# Patient Record
Sex: Male | Born: 2000 | Hispanic: Yes | Marital: Single | Smoking: Current some day smoker
Health system: Southern US, Community
[De-identification: ages and names within clinical notes are randomized; demographics above are authoritative.]

---

## 2020-05-05 ENCOUNTER — Encounter (HOSPITAL_COMMUNITY): Payer: Self-pay | Admitting: Emergency Medicine

## 2020-05-05 ENCOUNTER — Emergency Department (HOSPITAL_COMMUNITY)
Admission: EM | Admit: 2020-05-05 | Discharge: 2020-05-06 | Disposition: A | Discharge status: Home / Self Care | Payer: Self-pay | Attending: Emergency Medicine | Admitting: Emergency Medicine

## 2020-05-05 ENCOUNTER — Other Ambulatory Visit: Payer: Self-pay

## 2020-05-05 DIAGNOSIS — Z720 Tobacco use: Secondary | ICD-10-CM | POA: Insufficient documentation

## 2020-05-05 DIAGNOSIS — J029 Acute pharyngitis, unspecified: Secondary | ICD-10-CM | POA: Insufficient documentation

## 2020-05-05 NOTE — ED Triage Notes (Addendum)
Pt is spanish speaking. Pt c/o sore throat with painful swallowing x 1 week. No sick contacts. Pt states he was eating 1 week ago and found a piece of metal in his food and feels like that may have scratched his throat.

## 2020-05-06 ENCOUNTER — Emergency Department (HOSPITAL_COMMUNITY): Payer: Self-pay

## 2020-05-06 MED ORDER — AMOXICILLIN 500 MG PO CAPS: 500.0000 mg | ORAL_CAPSULE | Freq: Three times a day (TID) | ORAL | 0 refills | Status: AC

## 2020-05-06 NOTE — ED Provider Notes (Signed)
[facility] EMERGENCY DEPARTMENT Provider Note   CSN: [id number] Arrival date & time: 05/05/20  2220     History Chief Complaint  Patient presents with   Sore Throat    Tanner Soto is a 19 y.o. male.  Patient presents to the ED with a chief complaint of sore throat.  He states that approximately 1 week ago he swallowed and found a bit of metal in his food.  He states that he was able to remove the piece of metal, but thinks that it scratched his throat.  He is uncertain if he got all of it out.  He denies any fevers, chills, or cough.  Denies any other associated symptoms.  He complains of gradually worsening sore throat.  Denies any treatments prior to arrival.  The history is provided by the patient. The history is limited by a language barrier. A language interpreter was used.       History reviewed. No pertinent past medical history.  There are no problems to display for this patient.   History reviewed. No pertinent surgical history.     No family history on file.  Social History   Tobacco Use   Smoking status: Current Some Day Smoker   Smokeless tobacco: Never Used  Substance Use Topics   Alcohol use: Yes   Drug use: Never    Home Medications Prior to Admission medications   Not on File    Allergies    Patient has no known allergies.  Review of Systems   Review of Systems  All other systems reviewed and are negative.   Physical Exam Updated Vital Signs BP (!) 109/52 (BP Location: Right Arm)    Pulse (!) 56    Temp 99 F (37.2 C) (Oral)    Resp 16    SpO2 98%   Physical Exam Vitals and nursing note reviewed.  Constitutional:      General: He is not in acute distress.    Appearance: He is well-developed. He is not ill-appearing.  HENT:     Head: Normocephalic and atraumatic.     Mouth/Throat:     Comments: Oropharynx is moderately erythematous, with some minor tonsillar exudates, no evidence of tonsillar or  peritonsillar abscess, uvula is midline, airway intact, no stridor Eyes:     Conjunctiva/sclera: Conjunctivae normal.  Cardiovascular:     Rate and Rhythm: Normal rate.  Pulmonary:     Effort: Pulmonary effort is normal. No respiratory distress.  Abdominal:     General: There is no distension.  Musculoskeletal:     Cervical back: Neck supple.     Comments: Moves all extremities  Skin:    General: Skin is warm and dry.  Neurological:     Mental Status: He is alert and oriented to person, place, and time.  Psychiatric:        Mood and Affect: Mood normal.        Behavior: Behavior normal.     ED Results / Procedures / Treatments   Labs (all labs ordered are listed, but only abnormal results are displayed) Labs Reviewed - No data to display  EKG None  Radiology DG Neck Soft Tissue  Result Date: 05/06/2020 CLINICAL DATA:  Possibly swallowed metal last week.  Sore throat. EXAM: NECK SOFT TISSUES - 1+ VIEW COMPARISON:  None. FINDINGS: There is no evidence of retropharyngeal soft tissue swelling or epiglottic enlargement. The cervical airway is unremarkable and no radio-opaque foreign body identified. IMPRESSION: Negative. Electronically Signed  By: Marnee Spring M.D.   On: 05/06/2020 04:40    Procedures Procedures (including critical care time)  Medications Ordered in ED Medications - No data to display  ED Course  I have reviewed the triage vital signs and the nursing notes.  Pertinent labs & imaging results that were available during my care of the patient were reviewed by me and considered in my medical decision making (see chart for details).    MDM Rules/Calculators/A&P                          Patient here with sore throat.  He reports swallowing a piece of metal, or having a piece of metal get stuck in his throat about a week ago.  He was able to remove the piece of metal, but is uncertain if he got all of it.  Plain films of the neck show no evidence of metallic  foreign body.  His physical exam is consistent with pharyngitis.  He has oropharyngeal erythema with mild edema and tonsillar exudates.  Given the length of time of symptoms, will cover with amoxicillin.  Discharged home.  Return precautions discussed. Final Clinical Impression(s) / ED Diagnoses Final diagnoses:  Pharyngitis, unspecified etiology    Rx / DC Orders ED Discharge Orders         Ordered    amoxicillin (AMOXIL) 500 MG capsule  3 times daily     Discontinue  Reprint     05/06/20 0444           Roxy Horseman, PA-C 05/06/20 0446    Zadie Rhine, MD 05/07/20 [phone]

## 2020-05-06 NOTE — ED Notes (Signed)
Patient transported to X-ray 

## 2022-01-29 IMAGING — CR DG NECK SOFT TISSUE
2 series · 2 of 2 positions shown · non-contrast
Comparison: None.

CLINICAL DATA: Possibly swallowed metal last week.  Sore throat.

EXAM:
NECK SOFT TISSUES - 1+ VIEW

[neck lat]
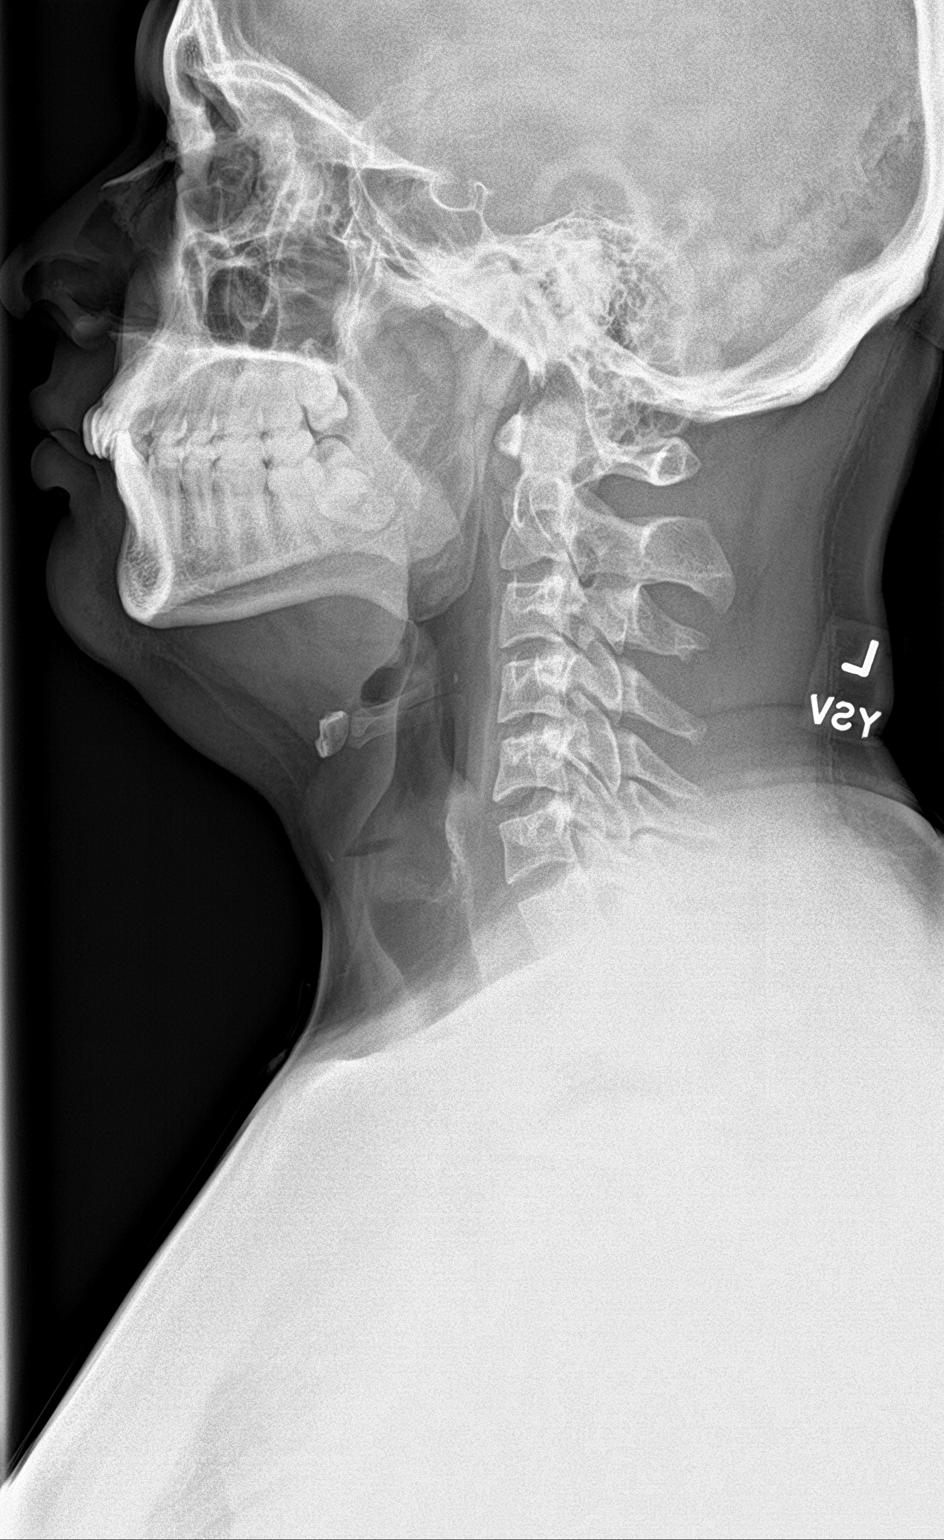

[neck ap]
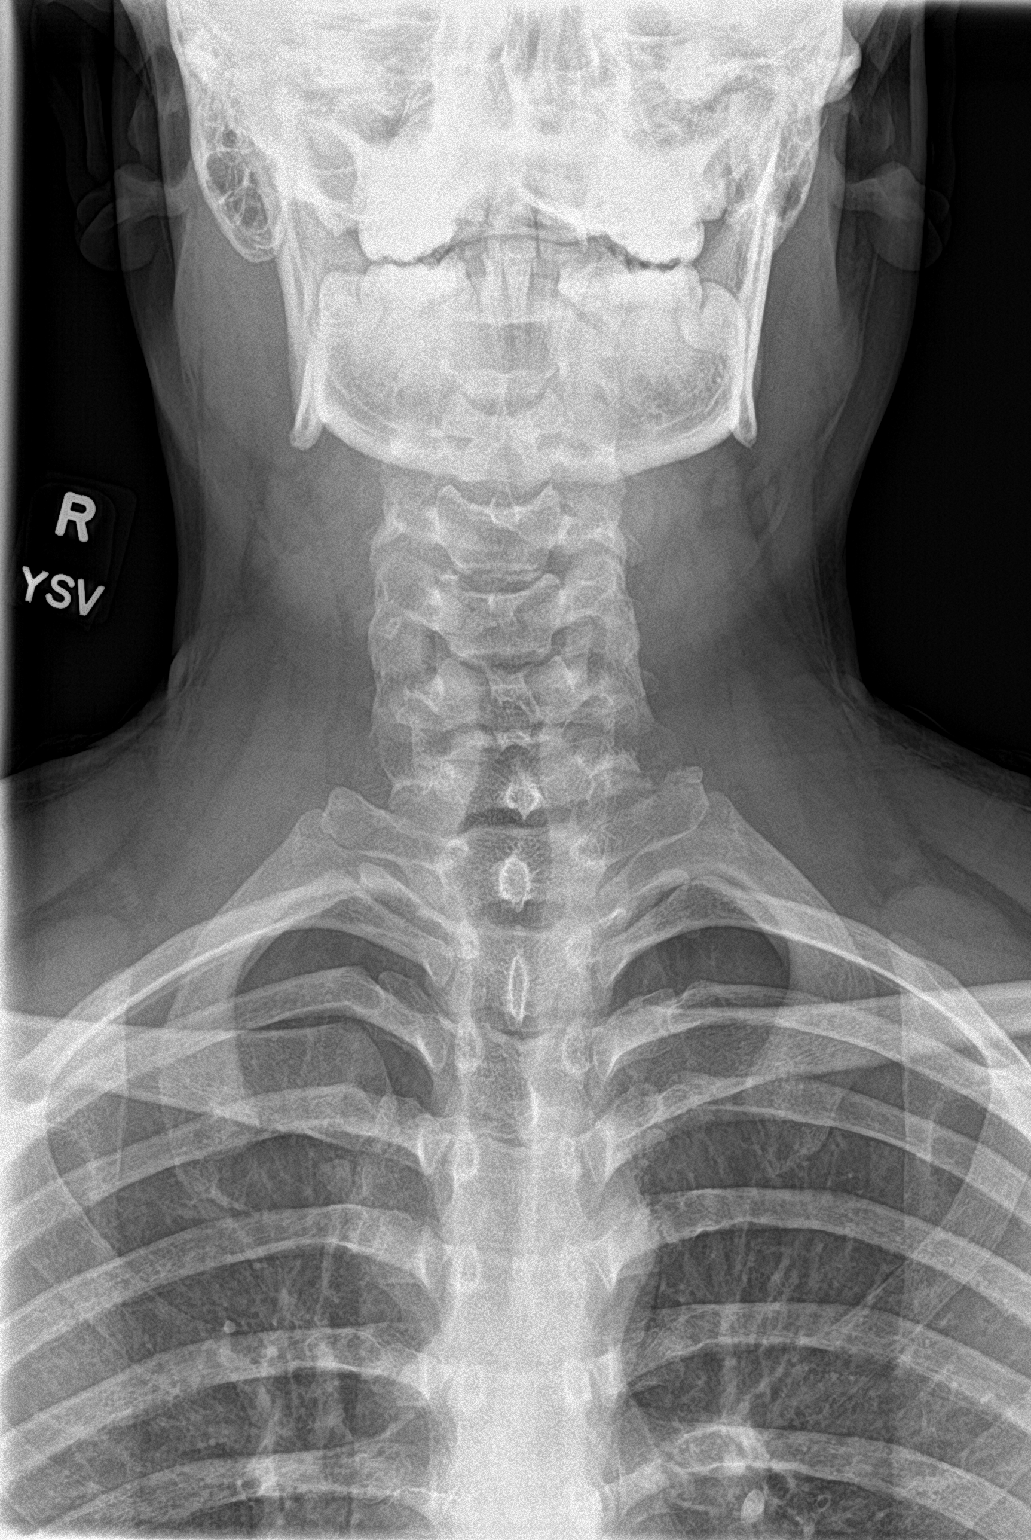

[2 of 2 positions shown; findings below may reference images not displayed]

FINDINGS: There is no evidence of retropharyngeal soft tissue swelling or
epiglottic enlargement. The cervical airway is unremarkable and no
radio-opaque foreign body identified.
IMPRESSION: Negative.
